# Patient Record
Sex: Male | Born: 2000 | Race: White | Hispanic: No | Marital: Single | Smoking: Never smoker
Health system: Southern US, Community
[De-identification: ages and names within clinical notes are randomized; demographics above are authoritative.]

## PROBLEM LIST (undated history)

## (undated) DIAGNOSIS — E119 Type 2 diabetes mellitus without complications: Secondary | ICD-10-CM

---

## 2018-07-11 ENCOUNTER — Other Ambulatory Visit: Payer: Self-pay

## 2018-07-11 ENCOUNTER — Emergency Department: Payer: 59

## 2018-07-11 ENCOUNTER — Encounter: Payer: Self-pay | Admitting: Emergency Medicine

## 2018-07-11 ENCOUNTER — Observation Stay
Admission: EM | Admit: 2018-07-11 | Discharge: 2018-07-12 | DRG: 871 | Disposition: A | Discharge status: Home / Self Care | Payer: 59 | Attending: Specialist | Admitting: Specialist

## 2018-07-11 DIAGNOSIS — Z794 Long term (current) use of insulin: Secondary | ICD-10-CM | POA: Diagnosis not present

## 2018-07-11 DIAGNOSIS — E1065 Type 1 diabetes mellitus with hyperglycemia: Secondary | ICD-10-CM | POA: Diagnosis present

## 2018-07-11 DIAGNOSIS — E119 Type 2 diabetes mellitus without complications: Secondary | ICD-10-CM

## 2018-07-11 DIAGNOSIS — E860 Dehydration: Secondary | ICD-10-CM | POA: Diagnosis present

## 2018-07-11 DIAGNOSIS — R05 Cough: Secondary | ICD-10-CM | POA: Diagnosis present

## 2018-07-11 DIAGNOSIS — J189 Pneumonia, unspecified organism: Secondary | ICD-10-CM | POA: Diagnosis present

## 2018-07-11 DIAGNOSIS — K529 Noninfective gastroenteritis and colitis, unspecified: Secondary | ICD-10-CM | POA: Diagnosis present

## 2018-07-11 DIAGNOSIS — A419 Sepsis, unspecified organism: Principal | ICD-10-CM | POA: Diagnosis present

## 2018-07-11 HISTORY — DX: Type 2 diabetes mellitus without complications: E11.9

## 2018-07-11 LAB — GLUCOSE, CAPILLARY
Glucose-Capillary: 222 mg/dL — ABNORMAL HIGH (ref 70–99)
Glucose-Capillary: 194 mg/dL — ABNORMAL HIGH (ref 70–99)
Glucose-Capillary: 273 mg/dL — ABNORMAL HIGH (ref 70–99)

## 2018-07-11 LAB — URINALYSIS, ROUTINE W REFLEX MICROSCOPIC
Bacteria, UA: NONE SEEN
Bilirubin Urine: NEGATIVE
Glucose, UA: >=500 mg/dL — AB
HGB URINE DIPSTICK: NEGATIVE
Ketones, ur: NEGATIVE mg/dL
Leukocytes, UA: NEGATIVE
Nitrite: NEGATIVE
Protein, ur: NEGATIVE mg/dL
Specific Gravity, Urine: 1.003 — ABNORMAL LOW (ref 1.005–1.030)
Squamous Epithelial / HPF: NONE SEEN (ref 0–5)
pH: 6 (ref 5.0–8.0)

## 2018-07-11 LAB — BLOOD GAS, VENOUS
Acid-Base Excess: 3.4 mmol/L — ABNORMAL HIGH (ref 0.0–2.0)
Bicarbonate: 26.8 mmol/L (ref 20.0–28.0)
O2 Saturation: 65.5 %
Patient temperature: 37
pCO2, Ven: 36 mmHg — ABNORMAL LOW (ref 44.0–60.0)
pH, Ven: 7.48 — ABNORMAL HIGH (ref 7.250–7.430)
pO2, Ven: 31 mmHg — CL (ref 32.0–45.0)

## 2018-07-11 LAB — CBC WITH DIFFERENTIAL/PLATELET
Abs Immature Granulocytes: 0.34 10*3/uL — ABNORMAL HIGH (ref 0.00–0.07)
Basophils Absolute: 0 10*3/uL (ref 0.0–0.1)
Basophils Relative: 0 %
Eosinophils Absolute: 0 10*3/uL (ref 0.0–0.5)
Eosinophils Relative: 0 %
HCT: 41.1 % (ref 39.0–52.0)
Hemoglobin: 14.1 g/dL (ref 13.0–17.0)
Immature Granulocytes: 2 %
Lymphocytes Relative: 3 %
Lymphs Abs: 0.5 10*3/uL — ABNORMAL LOW (ref 0.7–4.0)
MCH: 30.5 pg (ref 26.0–34.0)
MCHC: 34.3 g/dL (ref 30.0–36.0)
MCV: 89 fL (ref 80.0–100.0)
Monocytes Absolute: 1.3 10*3/uL — ABNORMAL HIGH (ref 0.1–1.0)
Monocytes Relative: 7 %
NEUTROS PCT: 88 %
Neutro Abs: 15.3 10*3/uL — ABNORMAL HIGH (ref 1.7–7.7)
Platelets: 240 10*3/uL (ref 150–400)
RBC: 4.62 MIL/uL (ref 4.22–5.81)
RDW: 12.5 % (ref 11.5–15.5)
Smear Review: NORMAL
WBC: 17.5 10*3/uL — ABNORMAL HIGH (ref 4.0–10.5)
nRBC: 0 % (ref 0.0–0.2)

## 2018-07-11 LAB — COMPREHENSIVE METABOLIC PANEL
ALT: 18 U/L (ref 0–44)
AST: 24 U/L (ref 15–41)
Albumin: 3.8 g/dL (ref 3.5–5.0)
Alkaline Phosphatase: 81 U/L (ref 38–126)
Anion gap: 9 (ref 5–15)
BUN: 17 mg/dL (ref 6–20)
CO2: 25 mmol/L (ref 22–32)
Calcium: 9.2 mg/dL (ref 8.9–10.3)
Chloride: 99 mmol/L (ref 98–111)
Creatinine, Ser: 1.08 mg/dL (ref 0.61–1.24)
GFR calc non Af Amer: >60 mL/min (ref >60)
Glucose, Bld: 250 mg/dL — ABNORMAL HIGH (ref 70–99)
Potassium: 4 mmol/L (ref 3.5–5.1)
Sodium: 133 mmol/L — ABNORMAL LOW (ref 135–145)
Total Bilirubin: 1.1 mg/dL (ref 0.3–1.2)
Total Protein: 6.8 g/dL (ref 6.5–8.1)

## 2018-07-11 LAB — INFLUENZA PANEL BY PCR (TYPE A & B)
Influenza A By PCR: NEGATIVE
Influenza B By PCR: NEGATIVE

## 2018-07-11 LAB — LACTIC ACID, PLASMA
Lactic Acid, Venous: 2.3 mmol/L (ref 0.5–1.9)
Lactic Acid, Venous: 2.2 mmol/L (ref 0.5–1.9)

## 2018-07-11 MED ORDER — INSULIN ASPART 100 UNIT/ML ~~LOC~~ SOLN
3.0000 [IU] | Freq: Three times a day (TID) | SUBCUTANEOUS | Status: DC
  Administered 2018-07-12: 13:00:00 3 [IU] via SUBCUTANEOUS

## 2018-07-11 MED ORDER — DIPHENHYDRAMINE HCL 25 MG PO CAPS
25.0000 mg | ORAL_CAPSULE | Freq: Every evening | ORAL | Status: DC | PRN
  Administered 2018-07-11: 25 mg via ORAL
  Filled 2018-07-11: qty 1

## 2018-07-11 MED ORDER — SODIUM CHLORIDE 0.9 % IV BOLUS
1000.0000 mL | Freq: Once | INTRAVENOUS | Status: AC
  Administered 2018-07-11: 1000 mL via INTRAVENOUS

## 2018-07-11 MED ORDER — SODIUM CHLORIDE 0.9 % IV SOLN
1.0000 g | INTRAVENOUS | Status: DC
  Filled 2018-07-11: qty 10

## 2018-07-11 MED ORDER — ONDANSETRON HCL 4 MG/2ML IJ SOLN
4.0000 mg | Freq: Once | INTRAMUSCULAR | Status: AC
  Administered 2018-07-11: 4 mg via INTRAVENOUS
  Filled 2018-07-11: qty 2

## 2018-07-11 MED ORDER — INSULIN ASPART 100 UNIT/ML ~~LOC~~ SOLN
0.0000 [IU] | Freq: Three times a day (TID) | SUBCUTANEOUS | Status: DC
  Administered 2018-07-12: 5 [IU] via SUBCUTANEOUS

## 2018-07-11 MED ORDER — INSULIN ASPART 100 UNIT/ML ~~LOC~~ SOLN
0.0000 [IU] | Freq: Every day | SUBCUTANEOUS | Status: DC
  Administered 2018-07-11 – 2018-07-12 (×2): 3 [IU] via SUBCUTANEOUS
  Filled 2018-07-11 (×2): qty 1

## 2018-07-11 MED ORDER — LEVALBUTEROL HCL 1.25 MG/0.5ML IN NEBU: 1.2500 mg | INHALATION_SOLUTION | Freq: Four times a day (QID) | RESPIRATORY_TRACT | Status: DC | PRN

## 2018-07-11 MED ORDER — INSULIN GLARGINE 100 UNIT/ML ~~LOC~~ SOLN
9.0000 [IU] | Freq: Every day | SUBCUTANEOUS | Status: DC
  Administered 2018-07-12: 03:00:00 9 [IU] via SUBCUTANEOUS
  Filled 2018-07-11 (×3): qty 0.09

## 2018-07-11 MED ORDER — ONDANSETRON HCL 4 MG/2ML IJ SOLN: 4.0000 mg | Freq: Four times a day (QID) | INTRAMUSCULAR | Status: DC | PRN

## 2018-07-11 MED ORDER — SODIUM CHLORIDE 0.9 % IV SOLN
INTRAVENOUS | Status: DC
  Administered 2018-07-11: 23:00:00 via INTRAVENOUS

## 2018-07-11 MED ORDER — SODIUM CHLORIDE 0.9 % IV SOLN
1.0000 g | Freq: Once | INTRAVENOUS | Status: AC
  Administered 2018-07-11: 1 g via INTRAVENOUS
  Filled 2018-07-11: qty 10

## 2018-07-11 MED ORDER — ENOXAPARIN SODIUM 40 MG/0.4ML ~~LOC~~ SOLN: 40.0000 mg | SUBCUTANEOUS | Status: DC

## 2018-07-11 MED ORDER — ACETAMINOPHEN 500 MG PO TABS
1000.0000 mg | ORAL_TABLET | Freq: Once | ORAL | Status: AC
  Administered 2018-07-11: 1000 mg via ORAL
  Filled 2018-07-11: qty 2

## 2018-07-11 MED ORDER — SODIUM CHLORIDE 0.9 % IV SOLN
500.0000 mg | INTRAVENOUS | Status: DC
  Filled 2018-07-11: qty 500

## 2018-07-11 MED ORDER — AZITHROMYCIN 500 MG PO TABS
500.0000 mg | ORAL_TABLET | Freq: Once | ORAL | Status: AC
  Administered 2018-07-11: 500 mg via ORAL
  Filled 2018-07-11: qty 1

## 2018-07-11 NOTE — Progress Notes (Signed)
CODE SEPSIS - PHARMACY COMMUNICATION  **Broad Spectrum Antibiotics should be administered within 1 hour of Sepsis diagnosis**  Time Code Sepsis Called/Page Received: 16:24  Antibiotics Ordered: azithromycin/ceftriaxone  Time of 1st antibiotic administration: 15:46  Additional action taken by pharmacy:   If necessary, Name of Provider/Nurse Contacted:     Carola Frost ,PharmD Clinical Pharmacist  07/11/2018  4:24 PM

## 2018-07-11 NOTE — ED Triage Notes (Signed)
Pt arrived via POV with reports of cough, chills, body aches and elevated blood sugars since Wednesday night. [place] last [place] he noticed elevated blood sugars in the 200s. 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  Pt also reports vomiting. Pt is pale on arrival.

## 2018-07-11 NOTE — H&P (Signed)
[facility] [place] NAME: Brad Stuart    MR#:  [id number]  DATE OF BIRTH:  11-21-2000  DATE OF ADMISSION:  07/11/2018  PRIMARY CARE PHYSICIAN: System, Pcp Not In   REQUESTING/REFERRING PHYSICIAN: Dr. Alfred Levins  CHIEF COMPLAINT:   Cough and body aches with nausea vomiting and diarrhea HISTORY OF PRESENT ILLNESS:  Brad Stuart  is a 18 y.o. male with a known history of insulin requiring diabetes mellitus came from [place] to visit his friend at [place]  who is sick with pneumonia.  After coming to [facility] patient started feeling sick was having fever, cough, nausea vomiting and diarrhea.  Feeling miserable  PAST MEDICAL HISTORY:   Past Medical History:  Diagnosis Date  . Diabetes mellitus without complication ([place])     PAST SURGICAL HISTOIRY:  History reviewed. No pertinent surgical history.  SOCIAL HISTORY:   Social History   Tobacco Use  . Smoking status: Never Smoker  . Smokeless tobacco: Never Used  Substance Use Topics  . Alcohol use: Not on file    FAMILY HISTORY:  No family history on file.  DRUG ALLERGIES:  No Known Allergies  REVIEW OF SYSTEMS:  CONSTITUTIONAL: Has fever, fatigue EYES: No blurred or double vision.  EARS, NOSE, AND THROAT: No tinnitus or ear pain.  RESPIRATORY: Reporting cough, shortness of breath, denies wheezing or hemoptysis.  CARDIOVASCULAR: No chest pain, orthopnea, edema.  GASTROINTESTINAL: Endorses nausea, vomiting, diarrhea GENITOURINARY: No dysuria, hematuria.  ENDOCRINE: No polyuria, nocturia,  HEMATOLOGY: No anemia, easy bruising or bleeding SKIN: No rash or lesion. MUSCULOSKELETAL: No joint pain or arthritis.   NEUROLOGIC: No tingling, numbness, weakness.  PSYCHIATRY: No anxiety or depression.   MEDICATIONS AT HOME:   Prior to Admission medications   Medication Sig Start Date End Date Taking? Authorizing Provider  insulin glargine (LANTUS) 100 UNIT/ML injection  Inject 32 Units as directed at bedtime. 02/18/18  Yes [provider]  insulin lispro (HUMALOG) 100 UNIT/ML injection Inject 100 Units into the skin daily. Uses up to 100 units daily to cover carbs and correct bg 3 vials=1 month supply 06/26/17  Yes [provider]      VITAL SIGNS:  Blood pressure (!) 126/58, pulse (!) 110, temperature (!) 101.6 F (38.7 C), resp. rate (!) 35, height '5\' 11"'  (1.803 m), weight 80.7 kg, SpO2 95 %.  PHYSICAL EXAMINATION:  GENERAL:  18 y.o.-year-old patient lying in the bed with no acute distress.  EYES: Pupils equal, round, reactive to light and accommodation. No scleral icterus. Extraocular muscles intact.  HEENT: Head atraumatic, normocephalic. Oropharynx and nasopharynx clear.  NECK:  Supple, no jugular venous distention. No thyroid enlargement, no tenderness.  LUNGS: Moderate breath sounds bilaterally, left side with crepitations and diminished breath sounds, no wheezing, rales,rhonchi . No use of accessory muscles of respiration.  CARDIOVASCULAR: S1, S2 normal. No murmurs, rubs, or gallops.  ABDOMEN: Soft, nontender, nondistended. Bowel sounds present. No organomegaly or mass.  EXTREMITIES: No pedal edema, cyanosis, or clubbing.  NEUROLOGIC: Awake, alert and oriented x3 sensation intact. Gait not checked.  PSYCHIATRIC: The patient is alert and oriented x 3.  SKIN: No obvious rash, lesion, or ulcer.   LABORATORY PANEL:   CBC Recent Labs  Lab 07/11/18 1516  WBC 17.5*  HGB 14.1  HCT 41.1  PLT 240   ------------------------------------------------------------------------------------------------------------------  Chemistries  Recent Labs  Lab 07/11/18 1516  NA 133*  K 4.0  CL 99  CO2 25  GLUCOSE  250*  BUN 17  CREATININE 1.08  CALCIUM 9.2  AST 24  ALT 18  ALKPHOS 81  BILITOT 1.1   ------------------------------------------------------------------------------------------------------------------  Cardiac Enzymes No  results for input(s): TROPONINI in the last 168 hours. ------------------------------------------------------------------------------------------------------------------  RADIOLOGY:  Dg Chest 2 View  Result Date: 07/11/2018 CLINICAL DATA:  Cough and fevers EXAM: CHEST - 2 VIEW COMPARISON:  None. FINDINGS: Cardiac shadows within normal limits. The lungs are well aerated bilaterally. The right lung is clear. Patchy infiltrate is noted in the left lower lobe. No sizable effusion is seen. No other focal abnormality is noted. IMPRESSION: Left lower lobe pneumonia. Electronically Signed   By: Inez Catalina M.D.   On: 07/11/2018 15:04    EKG:   Orders placed or performed during the hospital encounter of 07/11/18  . ED EKG 12-Lead  . ED EKG 12-Lead    IMPRESSION AND PLAN:    #Sepsis from pneumonia Admit to [place] unit Patient met septic criteria at the time of admission with elevated lactic acid, leukocytosis and tachycardia. Pancultures obtained and IV antibiotics and bronchodilator treatments Hydrate with IV fluids  #Pneumonia-community-acquired IV Rocephin and azithromycin Hydrate with IV fluids Sputum culture and sensitivity Flu test negative.  Check respiratory panel Supportive treatment, antitussives  #Acute gastroenteritis probably viral Hydrate with IV fluids Check GI panel and stool for C. difficile toxin if patient continues to have diarrhea  #Insulin requiring diabetes mellitus type 1 Sliding scale insulin with NovoLog Reduce long-acting insulin Lantus to 9 units for basal coverage patient takes 30 units on daily basis Diabetic core needle consult and check hemoglobin A1c   DVT prophylaxis with Lovenox    All the records are reviewed and case discussed with ED provider. Management plans discussed with the patient, he is in agreement.  Offered to call his mom regarding an update but patient refused at this time as ED physician already discussed the plan of care with  patient's mom  CODE STATUS: fc   TOTAL TIME TAKING CARE OF THIS PATIENT: 45  minutes.   Note: This dictation was prepared with Dragon dictation along with smaller phrase technology. Any transcriptional errors that result from this process are unintentional.  Nicholes Mango M.D on 07/11/2018 at 5:26 PM  Between 7am to 6pm - Pager - [phone]  After 6pm go to [email or web address] - password [email or web address] [place] [facility]  Office  [phone]  CC: Primary care physician; System, Pcp Not In

## 2018-07-11 NOTE — ED Provider Notes (Signed)
[facility] Emergency Department Provider Note  ____________________________________________  Time seen: Approximately 2:51 PM  I have reviewed the triage vital signs and the nursing notes.   HISTORY  Chief Complaint Hyperglycemia and Cough   HPI Brad Stuart is a 18 y.o. male with a history of type 1 diabetes who presents for evaluation of cough and hyperglycemia.  Patient reports that he is here visiting a friend who was recently diagnosed with pneumonia.  Patient reports that he received a flu shot 2 days ago.  He started having body aches that afternoon.  The day after he started having a dry cough, diffuse body aches and chills.  He flew yesterday from [place].  His friend was diagnosed with pneumonia yesterday.  He is complaining of feeling very dehydrated.  His sugars have been elevated for the last 24 hours in spite of insulin.  He reports that he has been unable to keep anything down for the last 24 hours.  Has had several episodes of nonbloody nonbilious emesis and diarrhea.  No chest pain.  Has had diffuse crampy abdominal pain.   Past Medical History:  Diagnosis Date  . Diabetes mellitus without complication [facility])     Patient Active Problem List   Diagnosis Date Noted  . Sepsis (HCC) 07/11/2018    History reviewed. No pertinent surgical history.  Prior to Admission medications   Medication Sig Start Date End Date Taking? Authorizing Provider  insulin glargine (LANTUS) 100 UNIT/ML injection Inject 32 Units as directed at bedtime. 02/18/18  Yes [provider]  insulin lispro (HUMALOG) 100 UNIT/ML injection Inject 100 Units into the skin daily. Uses up to 100 units daily to cover carbs and correct bg 3 vials=1 month supply 06/26/17  Yes [provider]    Allergies Patient has no known allergies.  No family history on file.  Social History Social History   Tobacco Use  . Smoking status:  Never Smoker  . Smokeless tobacco: Never Used  Substance Use Topics  . Alcohol use: Not on file  . Drug use: Yes    Types: Marijuana    Review of Systems  Constitutional: Negative for fever. + chills Eyes: Negative for visual changes. ENT: Negative for sore throat. Neck: No neck pain  Cardiovascular: Negative for chest pain. Respiratory: Negative for shortness of breath. + cough Gastrointestinal: + cramping abdominal pain, vomiting and diarrhea. Genitourinary: Negative for dysuria. Musculoskeletal: Negative for back pain. Skin: Negative for rash. Neurological: Negative for headaches, weakness or numbness. Psych: No SI or HI  ____________________________________________   PHYSICAL EXAM:  VITAL SIGNS: ED Triage Vitals [07/11/18 1429]  Enc Vitals Group     BP 111/65     Pulse Rate (!) 117     Resp (!) 26     Temp 99.8 F (37.7 C)     Temp Source Oral     SpO2 99 %     Weight      Height      Head Circumference      Peak Flow      Pain Score 8     Pain Loc      Pain Edu?      Excl. in GC?     Constitutional: Alert and oriented, pale, looks unwell.  HEENT:      Head: Normocephalic and atraumatic.         Eyes: Conjunctivae are normal. Sclera is non-icteric.  Mouth/Throat: Mucous membranes are dry.       Neck: Supple with no signs of meningismus. Cardiovascular: Tachycardic with regular rhythm. No murmurs, gallops, or rubs. 2+ symmetrical distal pulses are present in all extremities. No JVD. Respiratory: Increased work of breathing, normal sats, coarse rhonchi on the left, clear on the right  gastrointestinal: Soft, non tender, and non distended with positive bowel sounds. No rebound or guarding. Musculoskeletal: Nontender with normal range of motion in all extremities. No edema, cyanosis, or erythema of extremities. Neurologic: Normal speech and language. Face is symmetric. Moving all extremities. No gross focal neurologic deficits are appreciated. Skin:  Skin is warm, dry and intact. No rash noted. Psychiatric: Mood and affect are normal. Speech and behavior are normal.  ____________________________________________   LABS (all labs ordered are listed, but only abnormal results are displayed)  Labs Reviewed  GLUCOSE, CAPILLARY - Abnormal; Notable for the following components:      Result Value   Glucose-Capillary 222 (*)    All other components within normal limits  CBC WITH DIFFERENTIAL/PLATELET - Abnormal; Notable for the following components:   WBC 17.5 (*)    Neutro Abs 15.3 (*)    Lymphs Abs 0.5 (*)    Monocytes Absolute 1.3 (*)    Abs Immature Granulocytes 0.34 (*)    All other components within normal limits  BLOOD GAS, VENOUS - Abnormal; Notable for the following components:   pH, Ven 7.48 (*)    pCO2, Ven 36 (*)    pO2, Ven 31.0 (*)    Acid-Base Excess 3.4 (*)    All other components within normal limits  COMPREHENSIVE METABOLIC PANEL - Abnormal; Notable for the following components:   Sodium 133 (*)    Glucose, Bld 250 (*)    All other components within normal limits  LACTIC ACID, PLASMA - Abnormal; Notable for the following components:   Lactic Acid, Venous 2.3 (*)    All other components within normal limits  GLUCOSE, CAPILLARY - Abnormal; Notable for the following components:   Glucose-Capillary 194 (*)    All other components within normal limits  CULTURE, BLOOD (ROUTINE X 2)  CULTURE, BLOOD (ROUTINE X 2)  C DIFFICILE QUICK SCREEN W PCR REFLEX  RESPIRATORY PANEL BY PCR  EXPECTORATED SPUTUM ASSESSMENT W REFEX TO RESP CULTURE  GRAM STAIN  INFLUENZA PANEL BY PCR (TYPE A & B)  URINALYSIS, ROUTINE W REFLEX MICROSCOPIC  LACTIC ACID, PLASMA  HEMOGLOBIN A1C  NOROVIRUS GROUP 1 & 2 BY PCR, STOOL  HIV ANTIBODY (ROUTINE TESTING W REFLEX)  STREP PNEUMONIAE URINARY ANTIGEN  LEGIONELLA PNEUMOPHILA SEROGP 1 UR AG   ____________________________________________  EKG  ED ECG REPORT I, Nita Sicklearolina Shweta Aman, the  attending physician, personally viewed and interpreted this ECG.  Sinus tachycardia, rate of 107, normal intervals, right axis deviation, no ST elevations or depressions. No prior for comparison ____________________________________________  RADIOLOGY  I have personally reviewed the images performed during this visit and I agree with the Radiologist's read.   Interpretation by Radiologist:  Dg Chest 2 View  Result Date: 07/11/2018 CLINICAL DATA:  Cough and fevers EXAM: CHEST - 2 VIEW COMPARISON:  None. FINDINGS: Cardiac shadows within normal limits. The lungs are well aerated bilaterally. The right lung is clear. Patchy infiltrate is noted in the left lower lobe. No sizable effusion is seen. No other focal abnormality is noted. IMPRESSION: Left lower lobe pneumonia. Electronically Signed   By: Alcide CleverMark  Lukens M.D.   On: 07/11/2018 15:04    ____________________________________________  PROCEDURES  Procedure(s) performed: None Procedures Critical Care performed: yes  CRITICAL CARE Performed by: Nita Sicklearolina Coretta Leisey  ?  Total critical care time: 35 min  Critical care time was exclusive of separately billable procedures and treating other patients.  Critical care was necessary to treat or prevent imminent or life-threatening deterioration.  Critical care was time spent personally by me on the following activities: development of treatment plan with patient and/or surrogate as well as nursing, discussions with consultants, evaluation of patient's response to treatment, examination of patient, obtaining history from patient or surrogate, ordering and performing treatments and interventions, ordering and review of laboratory studies, ordering and review of radiographic studies, pulse oximetry and re-evaluation of patient's condition.  ____________________________________________   INITIAL IMPRESSION / ASSESSMENT AND PLAN / ED COURSE  18 y.o. male with a history of type 1 diabetes who  presents for evaluation of cough and hyperglycemia.  Patient looks unwell, shivering, pale, looks dry on exam, has coarse rhonchi on the left concerning for pneumonia.  Flu swab is pending.  Blood glucose of 222. Labs pending to rule out DKA. Patient started on IVF, zofran, rocephin and azithromycin.    ED COURSE: Labs consistent with sepsis.  Patient was admitted to the hospitalist service for further monitoring.  No evidence of DKA.   As part of my medical decision making, I reviewed the following data within the electronic MEDICAL RECORD NUMBER Nursing notes reviewed and incorporated, Labs reviewed , EKG interpreted , Radiograph reviewed , Discussed with admitting physician , Notes from prior ED visits and [place] Controlled Substance Database    Pertinent labs & imaging results that were available during my care of the patient were reviewed by me and considered in my medical decision making (see chart for details).    ____________________________________________   FINAL CLINICAL IMPRESSION(S) / ED DIAGNOSES  Final diagnoses:  Sepsis without acute organ dysfunction, due to unspecified organism (HCC)  Pneumonia due to infectious organism, unspecified laterality, unspecified part of lung      NEW MEDICATIONS STARTED DURING THIS VISIT:  ED Discharge Orders    None       Note:  This document was prepared using Dragon voice recognition software and may include unintentional dictation errors.    Nita SickleVeronese, [place], MD 07/11/18 2013

## 2018-07-12 LAB — RESPIRATORY PANEL BY PCR
Adenovirus: NOT DETECTED
Bordetella pertussis: NOT DETECTED
Chlamydophila pneumoniae: NOT DETECTED
Coronavirus 229E: NOT DETECTED
Coronavirus HKU1: NOT DETECTED
Coronavirus NL63: NOT DETECTED
Coronavirus OC43: NOT DETECTED
Influenza A: NOT DETECTED
Influenza B: NOT DETECTED
Metapneumovirus: NOT DETECTED
Mycoplasma pneumoniae: NOT DETECTED
Parainfluenza Virus 1: NOT DETECTED
Parainfluenza Virus 2: NOT DETECTED
Parainfluenza Virus 3: NOT DETECTED
Parainfluenza Virus 4: NOT DETECTED
RESPIRATORY SYNCYTIAL VIRUS-RVPPCR: NOT DETECTED
Rhinovirus / Enterovirus: NOT DETECTED

## 2018-07-12 LAB — EXPECTORATED SPUTUM ASSESSMENT W GRAM STAIN, RFLX TO RESP C

## 2018-07-12 LAB — CBC
HEMATOCRIT: 39.3 % (ref 39.0–52.0)
Hemoglobin: 13.4 g/dL (ref 13.0–17.0)
MCH: 30.9 pg (ref 26.0–34.0)
MCHC: 34.1 g/dL (ref 30.0–36.0)
MCV: 90.6 fL (ref 80.0–100.0)
Platelets: 273 10*3/uL (ref 150–400)
RBC: 4.34 MIL/uL (ref 4.22–5.81)
RDW: 12.6 % (ref 11.5–15.5)
WBC: 20.8 10*3/uL — ABNORMAL HIGH (ref 4.0–10.5)
nRBC: 0 % (ref 0.0–0.2)

## 2018-07-12 LAB — HEMOGLOBIN A1C
Hgb A1c MFr Bld: 7.4 % — ABNORMAL HIGH (ref 4.8–5.6)
Mean Plasma Glucose: 165.68 mg/dL

## 2018-07-12 LAB — GLUCOSE, CAPILLARY
Glucose-Capillary: 290 mg/dL — ABNORMAL HIGH (ref 70–99)
Glucose-Capillary: 285 mg/dL — ABNORMAL HIGH (ref 70–99)
Glucose-Capillary: 214 mg/dL — ABNORMAL HIGH (ref 70–99)
Glucose-Capillary: 256 mg/dL — ABNORMAL HIGH (ref 70–99)

## 2018-07-12 LAB — STREP PNEUMONIAE URINARY ANTIGEN: Strep Pneumo Urinary Antigen: NEGATIVE

## 2018-07-12 LAB — LACTIC ACID, PLASMA: LACTIC ACID, VENOUS: 2 mmol/L — AB (ref 0.5–1.9)

## 2018-07-12 MED ORDER — LEVOFLOXACIN 750 MG PO TABS: 750.0000 mg | ORAL_TABLET | Freq: Every day | ORAL | 0 refills | Status: AC

## 2018-07-12 NOTE — Discharge Summary (Signed)
[facility] NAME: Brad Stuart    MR#:  [id number]  DATE OF BIRTH:  [birth year]  DATE OF ADMISSION:  07/11/2018 ADMITTING PHYSICIAN: Nicholes Mango, MD  DATE OF DISCHARGE: 07/12/2018  PRIMARY CARE PHYSICIAN: System, Pcp Not In    ADMISSION DIAGNOSIS:  Pneumonia due to infectious organism, unspecified laterality, unspecified part of lung [J18.9] Sepsis without acute organ dysfunction, due to unspecified organism ([place]) [A41.9]  DISCHARGE DIAGNOSIS:  Active Problems:   Sepsis ([place])   SECONDARY DIAGNOSIS:   Past Medical History:  Diagnosis Date  . Diabetes mellitus without complication [facility])     HOSPITAL COURSE:   18 year old male with past medical history of diabetes who presented to the hospital due to fever, lethargy, nausea vomiting and cough.  1.  Sepsis-patient met criteria admission given his fever, leukocytosis and chest x-ray findings suggestive pneumonia. -Patient was treated with IV ceftriaxone, Zithromax for community-acquired pneumonia. - His white cell count still remains elevated but he is hemodynamically stable and afebrile now.  His cultures remain negative.  He is being discharged on oral Levaquin for additional few days.  2.  Pneumonia-source of patient's sepsis.  Initially treated with IV ceftriaxone, Zithromax. -Now being discharged on oral Levaquin for additional 5 days.    3.  Nausea vomiting/diarrhea-much improved with supportive care.  Patient is tolerating p.o. well with no nausea or vomiting now. - Continue supportive care.  4.  Diabetes type 1 without complication- blood sugars improved since admission and patient will continue his Lantus and Humalog as stated below.  DISCHARGE CONDITIONS:   Stable  CONSULTS OBTAINED:    DRUG ALLERGIES:  No Known Allergies  DISCHARGE MEDICATIONS:   Allergies as of 07/12/2018   No Known Allergies     Medication List    TAKE these medications   insulin  lispro 100 UNIT/ML injection Commonly known as:  HUMALOG Inject 100 Units into the skin daily. Uses up to 100 units daily to cover carbs and correct bg 3 vials=1 month supply   LANTUS 100 UNIT/ML injection Generic drug:  insulin glargine Inject 32 Units as directed at bedtime.   levofloxacin 750 MG tablet Commonly known as:  LEVAQUIN Take 1 tablet (750 mg total) by mouth daily for 5 days.         DISCHARGE INSTRUCTIONS:   DIET:  Diabetic diet  DISCHARGE CONDITION:  Stable  ACTIVITY:  Activity as tolerated  OXYGEN:  Home Oxygen: No.   Oxygen Delivery: room air  DISCHARGE LOCATION:  home   If you experience worsening of your admission symptoms, develop shortness of breath, life threatening emergency, suicidal or homicidal thoughts you must seek medical attention immediately by calling 911 or calling your MD immediately  if symptoms less severe.  You Must read complete instructions/literature along with all the possible adverse reactions/side effects for all the Medicines you take and that have been prescribed to you. Take any new Medicines after you have completely understood and accpet all the possible adverse reactions/side effects.   Please note  You were cared for by a hospitalist during your hospital stay. If you have any questions about your discharge medications or the care you received while you were in the hospital after you are discharged, you can call the unit and asked to speak with the hospitalist on call if the hospitalist that took care of you is not available. Once you are discharged, your primary care physician will handle any further medical issues.  Please note that NO REFILLS for any discharge medications will be authorized once you are discharged, as it is imperative that you return to your primary care physician (or establish a relationship with a primary care physician if you do not have one) for your aftercare needs so that they can reassess your need  for medications and monitor your lab values.     Today   Afebrile, hemodynamically stable.  Ambulated with no further hypoxemia.  Stable to be discharged home.  VITAL SIGNS:  Blood pressure (!) 124/57, pulse 94, temperature 98.2 F (36.8 C), resp. rate 18, height '5\' 11"'  (1.803 m), weight 80.7 kg, SpO2 96 %.  I/O:    Intake/Output Summary (Last 24 hours) at 07/12/2018 1256 Last data filed at 07/12/2018 0255 Gross per 24 hour  Intake 1373.11 ml  Output 700 ml  Net 673.11 ml    PHYSICAL EXAMINATION:  GENERAL:  18 y.o.-year-old patient lying in the bed with no acute distress.  EYES: Pupils equal, round, reactive to light and accommodation. No scleral icterus. Extraocular muscles intact.  HEENT: Head atraumatic, normocephalic. Oropharynx and nasopharynx clear.  NECK:  Supple, no jugular venous distention. No thyroid enlargement, no tenderness.  LUNGS: Normal breath sounds bilaterally, no wheezing, rales,rhonchi. No use of accessory muscles of respiration.  CARDIOVASCULAR: S1, S2 normal. No murmurs, rubs, or gallops.  ABDOMEN: Soft, non-tender, non-distended. Bowel sounds present. No organomegaly or mass.  EXTREMITIES: No pedal edema, cyanosis, or clubbing.  NEUROLOGIC: Cranial nerves II through XII are intact. No focal motor or sensory defecits b/l.  PSYCHIATRIC: The patient is alert and oriented x 3.  SKIN: No obvious rash, lesion, or ulcer.   DATA REVIEW:   CBC Recent Labs  Lab 07/12/18 1019  WBC 20.8*  HGB 13.4  HCT 39.3  PLT 273    Chemistries  Recent Labs  Lab 07/11/18 1516  NA 133*  K 4.0  CL 99  CO2 25  GLUCOSE 250*  BUN 17  CREATININE 1.08  CALCIUM 9.2  AST 24  ALT 18  ALKPHOS 81  BILITOT 1.1    Cardiac Enzymes No results for input(s): TROPONINI in the last 168 hours.  Microbiology Results  Results for orders placed or performed during the hospital encounter of 07/11/18  Blood culture (routine x 2)     Status: None (Preliminary result)    Collection Time: 07/11/18  3:36 PM  Result Value Ref Range Status   Specimen Description BLOOD LAC  Final   Special Requests   Final    BOTTLES DRAWN AEROBIC AND ANAEROBIC Blood Culture adequate volume   Culture   Final    NO GROWTH < 24 HOURS Performed at [facility], [address] [place], [place] [address]    Report Status PENDING  Incomplete  Blood culture (routine x 2)     Status: None (Preliminary result)   Collection Time: 07/11/18  3:36 PM  Result Value Ref Range Status   Specimen Description BLOOD L FA  Final   Special Requests   Final    BOTTLES DRAWN AEROBIC AND ANAEROBIC Blood Culture adequate volume   Culture   Final    NO GROWTH < 24 HOURS Performed at [facility], [address], [place], [place] [address]    Report Status PENDING  Incomplete  Culture, sputum-assessment     Status: None   Collection Time: 07/11/18  7:35 PM  Result Value Ref Range Status   Specimen Description SPUTUM  Final  Special Requests NONE  Final   Sputum evaluation   Final    THIS SPECIMEN IS ACCEPTABLE FOR SPUTUM CULTURE Performed at [facility], [address] [place], [place] [address]    Report Status 07/12/2018 FINAL  Final  Culture, respiratory     Status: None (Preliminary result)   Collection Time: 07/11/18  7:35 PM  Result Value Ref Range Status   Specimen Description   Final    SPUTUM Performed at [facility], [address] [place], [place] [address]    Special Requests   Final    NONE Reflexed from [id number] Performed at [facility], [place], [place] [address]    Gram Stain   Final    MODERATE WBC PRESENT,BOTH PMN AND MONONUCLEAR MODERATE GRAM POSITIVE COCCI MODERATE GRAM VARIABLE ROD    Culture   Final    TOO YOUNG TO READ Performed at [facility], [place] [address], [place], [place] [address]    Report Status PENDING  Incomplete    RADIOLOGY:  Dg Chest 2 View  Result Date:  07/11/2018 CLINICAL DATA:  Cough and fevers EXAM: CHEST - 2 VIEW COMPARISON:  None. FINDINGS: Cardiac shadows within normal limits. The lungs are well aerated bilaterally. The right lung is clear. Patchy infiltrate is noted in the left lower lobe. No sizable effusion is seen. No other focal abnormality is noted. IMPRESSION: Left lower lobe pneumonia. Electronically Signed   By: Inez Catalina M.D.   On: 07/11/2018 15:04      Management plans discussed with the patient, family and they are in agreement.  CODE STATUS:     Code Status Orders  (From admission, onward)         Start     Ordered   07/11/18 1836  Full code  Continuous     07/11/18 1836        Code Status History    This patient has a current code status but no historical code status.      TOTAL TIME TAKING CARE OF THIS PATIENT: 40 minutes.    Henreitta Leber M.D on 07/12/2018 at 12:56 PM  Between 7am to 6pm - Pager - [phone]  After 6pm go to [email or web address] - Proofreader  [facility]  Office  [phone]  CC: Primary care physician; System, Pcp Not In

## 2018-07-12 NOTE — Progress Notes (Signed)
[facility]    Brad Stuart was admitted to the Hospital on 07/11/2018 and Discharged  07/12/2018 and was not fit to travel during that time period.    Please excuse him from work/school for 4  days starting 07/11/2018 , may return to work/school without any restrictions.  Call Hilda Lias MD, [facility]  [phone] with questions.  Brad Stuart M.D on 07/12/2018,at 1:25 PM

## 2018-07-12 NOTE — Progress Notes (Signed)
Inpatient Diabetes Program Recommendations  AACE/ADA: New Consensus Statement on Inpatient Glycemic Control  Target Ranges:  Prepandial:   less than 140 mg/dL      Peak postprandial:   less than 180 mg/dL (1-2 hours)      Critically ill patients:  140 - 180 mg/dL   Results for Brad Stuart, Brad Stuart (MRN [id number]) as of 07/12/2018 09:04  Ref. Range 07/11/2018 14:37 07/11/2018 17:20 07/11/2018 21:18 07/12/2018 02:49 07/12/2018 04:51 07/12/2018 07:39  Glucose-Capillary Latest Ref Range: 70 - 99 mg/dL 672 (H) 094 (H) 709 (H) 290 (H) 285 (H) 214 (H)  Results for Brad Stuart, Brad Stuart (MRN [id number]) as of 07/12/2018 09:04  Ref. Range 07/12/2018 01:21  Hemoglobin A1C Latest Ref Range: 4.8 - 5.6 % 7.4 (H)   Review of Glycemic Control  Diabetes history: DM1 (makes NO insulin; requires insulin for all needs - basal, correction, and carbohydrate coverage) Outpatient Diabetes medications: Lantus 32 units QHS, Humalog for correction and to cover carbohydrates Current orders for Inpatient glycemic control: Lantus 9 units daily, Novolog 0-9 units TID with meals, Novolog 0-5 units QHS, Novolog 3 units TID with meals for meal coverage  Inpatient Diabetes Program Recommendations:  Insulin - Basal: Please consider increasing Lantus to 20 units daily. HgbA1C: A1C 7.4% on 07/12/18 indicating an average glucocse of 166 mg/dl over the past 2-3 months.  NOTE:. Noted consult for Diabetes Coordinator. Diabetes Coordinator is not on campus over the weekend but available by pager from 8am to 5pm for questions or concerns. Chart reviewed. Noted patient has Type 1 DM and [place]. Per Care Everywhere, patient is followed by Pediatric Endocrinology in [place], [place]. Patient was last seen on 07/09/18 by Dr. Ross Marcus and A1C was 7.1%. Per Endocrinology office notes, patient has fairly good DM control. Patient has been admitted with Pneumonia and initial glucose was 222 mg/dl at 7/65/46. Anticipate patient will require  more basal insulin than currently ordered. Recommend increasing Lantus to 20 units daily (and continue Novolog correction and meal coverage as ordered).   Thanks, Orlando Penner, RN, MSN, CDE Diabetes Coordinator Inpatient Diabetes Program [phone] (Team Pager from 8am to 5pm)

## 2018-07-13 LAB — HIV ANTIBODY (ROUTINE TESTING W REFLEX): HIV Screen 4th Generation wRfx: NONREACTIVE

## 2018-07-13 LAB — LEGIONELLA PNEUMOPHILA SEROGP 1 UR AG: L. pneumophila Serogp 1 Ur Ag: NEGATIVE

## 2018-07-14 LAB — CULTURE, RESPIRATORY W GRAM STAIN: Culture: NORMAL

## 2018-07-16 ENCOUNTER — Telehealth: Payer: Self-pay

## 2018-07-16 LAB — CULTURE, BLOOD (ROUTINE X 2)
CULTURE: NO GROWTH
Culture: NO GROWTH
Special Requests: ADEQUATE
Special Requests: ADEQUATE

## 2018-07-16 NOTE — Telephone Encounter (Signed)
[facility] Follow-up: Noted on the report that the patient didn't know who to call if there was a change in his condition but had other questions.  No follow-up appointment noted on the After Visit Summary.  I talked with Brad Stuart and he was here visiting a friend in the [place] when he needed hospitalization.  [name] he flew back home on Sunday but had left his discharge paperwork and 3 days of anitboitics here and wondered if the MD could call in another Rx.or just not worry about the last 3 days. Suggested he see his PCP if there was a change in his condition or was still not feeling well.  Thanked me for calling and said he received great care here. No other needs noted.
